# Patient Record
Sex: Male | Born: 1995 | Race: Black or African American | Hispanic: No | Marital: Married | Smoking: Current every day smoker
Health system: Southern US, Community
[De-identification: ages and names within clinical notes are randomized; demographics above are authoritative.]

## PROBLEM LIST (undated history)

## (undated) HISTORY — PX: TESTICLE REMOVAL: SHX68

---

## 2021-04-12 ENCOUNTER — Other Ambulatory Visit: Payer: Self-pay

## 2021-04-12 ENCOUNTER — Emergency Department (HOSPITAL_COMMUNITY)
Admission: EM | Admit: 2021-04-12 | Discharge: 2021-04-12 | Disposition: A | Discharge status: Home / Self Care | Payer: Self-pay

## 2021-04-12 ENCOUNTER — Emergency Department (HOSPITAL_COMMUNITY): Payer: Self-pay

## 2021-04-12 ENCOUNTER — Encounter (HOSPITAL_COMMUNITY): Payer: Self-pay | Admitting: Emergency Medicine

## 2021-04-12 ENCOUNTER — Emergency Department (HOSPITAL_COMMUNITY)
Admission: EM | Admit: 2021-04-12 | Discharge: 2021-04-12 | Disposition: A | Discharge status: Home / Self Care | Payer: Self-pay | Attending: Emergency Medicine | Admitting: Emergency Medicine

## 2021-04-12 DIAGNOSIS — S62141A Displaced fracture of body of hamate [unciform] bone, right wrist, initial encounter for closed fracture: Secondary | ICD-10-CM | POA: Insufficient documentation

## 2021-04-12 DIAGNOSIS — W228XXA Striking against or struck by other objects, initial encounter: Secondary | ICD-10-CM | POA: Insufficient documentation

## 2021-04-12 MED ORDER — OXYCODONE-ACETAMINOPHEN 5-325 MG PO TABS: 1.0000 | ORAL_TABLET | Freq: Four times a day (QID) | ORAL | 0 refills | Status: AC | PRN

## 2021-04-12 MED ORDER — OXYCODONE-ACETAMINOPHEN 5-325 MG PO TABS
2.0000 | ORAL_TABLET | Freq: Once | ORAL | Status: AC
  Administered 2021-04-12: 2 via ORAL
  Filled 2021-04-12: qty 2

## 2021-04-12 MED ORDER — OXYCODONE-ACETAMINOPHEN 5-325 MG PO TABS
1.0000 | ORAL_TABLET | Freq: Three times a day (TID) | ORAL | 0 refills | Status: AC | PRN
Start: 2021-04-12 — End: ?

## 2021-04-12 NOTE — ED Triage Notes (Signed)
Pt states right hand got stuck between 2 pieces of furniture while moving yesterday.

## 2021-04-12 NOTE — ED Provider Notes (Signed)
[facility] EMERGENCY DEPARTMENT Provider Note   CSN: [id number] Arrival date & time: 04/12/21  7829     History  Chief Complaint  Patient presents with   Hand Pain    Daniel Drake is a 26 y.o. male.  Patient was movement dresser yesterday when it pinned his right hand between the dresser and a railing going on the steps.  He has had persistent pain and swelling to his right wrist and hand since that time.  Presents here for evaluation.  No other injuries.  No home medications.   Hand Pain      Home Medications Prior to Admission medications   Medication Sig Start Date End Date Taking? Authorizing Provider  oxyCODONE-acetaminophen (PERCOCET) 5-325 MG tablet Take 1-2 tablets by mouth every 8 (eight) hours as needed for severe pain. 04/12/21  Yes Kirstein Baxley, Barbara Cower, MD  oxyCODONE-acetaminophen (PERCOCET/ROXICET) 5-325 MG tablet Take 1 tablet by mouth every 6 (six) hours as needed for severe pain. 04/12/21  Yes Kerron Sedano, Barbara Cower, MD      Allergies    Patient has no allergy information on record.    Review of Systems   Review of Systems  Physical Exam Updated Vital Signs BP 135/86    Pulse (!) 56    Temp 98.6 F (37 C)    Resp 18    Ht 5\' 7"  (1.702 m)    Wt 56.7 kg    SpO2 100%    BMI 19.58 kg/m  Physical Exam Vitals and nursing note reviewed.  Constitutional:      Appearance: He is well-developed.  HENT:     Head: Normocephalic and atraumatic.     Mouth/Throat:     Mouth: Mucous membranes are moist.     Pharynx: Oropharynx is clear.  Eyes:     Pupils: Pupils are equal, round, and reactive to light.  Cardiovascular:     Rate and Rhythm: Normal rate.  Pulmonary:     Effort: Pulmonary effort is normal. No respiratory distress.  Abdominal:     General: There is no distension.  Musculoskeletal:        General: Tenderness (Pain, swelling and tenderness over dorsum of his right hand) present. Normal range of motion.     Cervical back: Normal range of motion.  Skin:    General:  Skin is warm and dry.  Neurological:     General: No focal deficit present.     Mental Status: He is alert.    ED Results / Procedures / Treatments   Labs (all labs ordered are listed, but only abnormal results are displayed) Labs Reviewed - No data to display  EKG None  Radiology DG Wrist Complete Right  Result Date: 04/12/2021 CLINICAL DATA:  Pain and swelling to the right hand wrist after crush injury moving furniture. EXAM: RIGHT WRIST - COMPLETE 3+ VIEW COMPARISON:  None. FINDINGS: Three views study shows a fracture in the ulnar aspect of the distal carpus. This is more distal than typically seen for classic triquetral fracture and is probably a fracture involving the posterior hamate extending into the articular surface distally. No other acute fracture identified. No subluxation dislocation. IMPRESSION: Posterior fracture involving the ulnar aspect of the distal carpus, compatible with a fracture involving the hamate extending to the distal articular surface. CT imaging could be used to further evaluate as clinically warranted. Electronically Signed   By: 04/14/2021 M.D.   On: 04/12/2021 06:10   DG Hand Complete Right  Result Date: 04/12/2021 CLINICAL  DATA:  Pain and swelling after crush injury moving furniture. EXAM: RIGHT HAND - COMPLETE 3+ VIEW COMPARISON:  None. FINDINGS: As on the wrist x-rays, there is a fracture fragment seen posteriorly and arising from the distal carpus. Fracture involves the hamate bone with extension into the articular surface with the fifth metacarpal. No other acute fracture evident. No subluxation or dislocation. IMPRESSION: Hamate bone fracture with intra-articular extension into the [facility] joint. Electronically Signed   By: Kennith Center M.D.   On: 04/12/2021 06:13    Procedures Procedures    Medications Ordered in ED Medications  oxyCODONE-acetaminophen (PERCOCET/ROXICET) 5-325 MG per tablet 2 tablet (2 tablets Oral Given 04/12/21 0555)    ED  Course/ Medical Decision Making/ A&P                           Medical Decision Making Amount and/or Complexity of Data Reviewed Radiology: ordered.  Risk Prescription drug management.   X-ray reviewed by myself and verified by radiology shows a hamate fracture.  Will place in a splint and a sling.  Discussed with Dr. Dallas Schimke with orthopedic surgery whose office will call with an appointment in the next week or so.  No further management in the emergency department required for the orthopedic surgeon.   Final Clinical Impression(s) / ED Diagnoses Final diagnoses:  Closed displaced fracture of hamate bone of right wrist, unspecified portion of hamate, initial encounter    Rx / DC Orders ED Discharge Orders          Ordered    oxyCODONE-acetaminophen (PERCOCET/ROXICET) 5-325 MG tablet  Every 6 hours PRN        04/12/21 0637    oxyCODONE-acetaminophen (PERCOCET) 5-325 MG tablet  Every 8 hours PRN        04/12/21 0638              Ademide Schaberg, Barbara Cower, MD 04/12/21 7416

## 2021-04-14 ENCOUNTER — Other Ambulatory Visit: Payer: Self-pay

## 2021-04-14 DIAGNOSIS — S62141A Displaced fracture of body of hamate [unciform] bone, right wrist, initial encounter for closed fracture: Secondary | ICD-10-CM

## 2021-04-14 MED FILL — Oxycodone w/ Acetaminophen Tab 5-325 MG: ORAL | Qty: 6 | Status: AC

## 2021-04-14 NOTE — Progress Notes (Signed)
Dr. Amedeo Kinsman has reviewed pt images and would like him to be referred to Dr. Tempie Donning for evaluation. Called pt and let him know the steps that are being taken and he verbalized understanding.

## 2021-04-22 ENCOUNTER — Ambulatory Visit: Payer: Self-pay | Admitting: Orthopedic Surgery

## 2022-02-04 ENCOUNTER — Emergency Department (HOSPITAL_COMMUNITY)
Admission: EM | Admit: 2022-02-04 | Discharge: 2022-02-05 | Disposition: A | Discharge status: Home / Self Care | Payer: Self-pay | Attending: Emergency Medicine | Admitting: Emergency Medicine

## 2022-02-04 ENCOUNTER — Encounter (HOSPITAL_COMMUNITY): Payer: Self-pay

## 2022-02-04 ENCOUNTER — Other Ambulatory Visit: Payer: Self-pay

## 2022-02-04 ENCOUNTER — Emergency Department (HOSPITAL_COMMUNITY): Payer: Self-pay

## 2022-02-04 DIAGNOSIS — N451 Epididymitis: Secondary | ICD-10-CM

## 2022-02-04 DIAGNOSIS — N453 Epididymo-orchitis: Secondary | ICD-10-CM | POA: Insufficient documentation

## 2022-02-04 LAB — URINALYSIS, ROUTINE W REFLEX MICROSCOPIC
Bilirubin Urine: NEGATIVE
Glucose, UA: NEGATIVE mg/dL
Hgb urine dipstick: NEGATIVE
Ketones, ur: 20 mg/dL — AB
Leukocytes,Ua: NEGATIVE
Nitrite: NEGATIVE
Protein, ur: NEGATIVE mg/dL
Specific Gravity, Urine: 1.011 (ref 1.005–1.030)
pH: 8 (ref 5.0–8.0)

## 2022-02-04 LAB — BASIC METABOLIC PANEL
Anion gap: 9 (ref 5–15)
BUN: 6 mg/dL (ref 6–20)
CO2: 25 mmol/L (ref 22–32)
Calcium: 9 mg/dL (ref 8.9–10.3)
Chloride: 102 mmol/L (ref 98–111)
Creatinine, Ser: 0.81 mg/dL (ref 0.61–1.24)
GFR, Estimated: >60 mL/min (ref >60)
Glucose, Bld: 87 mg/dL (ref 70–99)
Potassium: 3.4 mmol/L — ABNORMAL LOW (ref 3.5–5.1)
Sodium: 136 mmol/L (ref 135–145)

## 2022-02-04 LAB — CBC
HCT: 38 % — ABNORMAL LOW (ref 39.0–52.0)
Hemoglobin: 13.8 g/dL (ref 13.0–17.0)
MCH: 29.6 pg (ref 26.0–34.0)
MCHC: 36.3 g/dL — ABNORMAL HIGH (ref 30.0–36.0)
MCV: 81.4 fL (ref 80.0–100.0)
Platelets: 306 10*3/uL (ref 150–400)
RBC: 4.67 MIL/uL (ref 4.22–5.81)
RDW: 12.2 % (ref 11.5–15.5)
WBC: 8.7 10*3/uL (ref 4.0–10.5)
nRBC: 0 % (ref 0.0–0.2)

## 2022-02-04 MED ORDER — OXYCODONE-ACETAMINOPHEN 5-325 MG PO TABS
2.0000 | ORAL_TABLET | Freq: Once | ORAL | Status: AC
  Administered 2022-02-04: 2 via ORAL
  Filled 2022-02-04: qty 2

## 2022-02-04 NOTE — ED Notes (Signed)
ED Provider at bedside. 

## 2022-02-04 NOTE — ED Triage Notes (Signed)
POV from home . Cc of swelling and pain in his left testicle. Says he only has one, the other was removed when he was younger. Denies urinary symptoms.

## 2022-02-04 NOTE — ED Provider Notes (Signed)
Care assumed from Dr. Hyacinth Meeker, patient with one testicle which has been swollen and painful, pending scrotal ultrasound.  Ultrasound shows evidence of epididymoorchitis, no evidence of torsion.  Have independently viewed the images, and agree with the radiologist's interpretation.  I have ordered a dose of ceftriaxone and doxycycline and I am discharging him with a prescription for a 2-week course of doxycycline.  Results for orders placed or performed during the hospital encounter of 02/04/22  Urinalysis, Routine w reflex microscopic  Result Value Ref Range   Color, Urine YELLOW YELLOW   APPearance CLEAR CLEAR   Specific Gravity, Urine 1.011 1.005 - 1.030   pH 8.0 5.0 - 8.0   Glucose, UA NEGATIVE NEGATIVE mg/dL   Hgb urine dipstick NEGATIVE NEGATIVE   Bilirubin Urine NEGATIVE NEGATIVE   Ketones, ur 20 (A) NEGATIVE mg/dL   Protein, ur NEGATIVE NEGATIVE mg/dL   Nitrite NEGATIVE NEGATIVE   Leukocytes,Ua NEGATIVE NEGATIVE  CBC  Result Value Ref Range   WBC 8.7 4.0 - 10.5 K/uL   RBC 4.67 4.22 - 5.81 MIL/uL   Hemoglobin 13.8 13.0 - 17.0 g/dL   HCT 38.2 (L) 50.5 - 39.7 %   MCV 81.4 80.0 - 100.0 fL   MCH 29.6 26.0 - 34.0 pg   MCHC 36.3 (H) 30.0 - 36.0 g/dL   RDW 67.3 41.9 - 37.9 %   Platelets 306 150 - 400 K/uL   nRBC 0.0 0.0 - 0.2 %  Basic metabolic panel  Result Value Ref Range   Sodium 136 135 - 145 mmol/L   Potassium 3.4 (L) 3.5 - 5.1 mmol/L   Chloride 102 98 - 111 mmol/L   CO2 25 22 - 32 mmol/L   Glucose, Bld 87 70 - 99 mg/dL   BUN 6 6 - 20 mg/dL   Creatinine, Ser 0.24 0.61 - 1.24 mg/dL   Calcium 9.0 8.9 - 09.7 mg/dL   GFR, Estimated >35 >32 mL/min   Anion gap 9 5 - 15   US SCROTUM W/DOPPLER  Result Date: 02/04/2022 CLINICAL DATA:  Testicle pain on the left with swelling EXAM: SCROTAL ULTRASOUND DOPPLER ULTRASOUND OF THE TESTICLES TECHNIQUE: Complete ultrasound examination of the testicles, epididymis, and other scrotal structures was performed. Color and spectral Doppler  ultrasound were also utilized to evaluate blood flow to the testicles. COMPARISON:  None Available. FINDINGS: Right testicle Surgically removed. Left testicle Measurements: 3.9 x 2.2 x 2.9 cm. No mass or microlithiasis. Increased vascularity. Diffuse scrotal wall thickening/edema with hyperemia. Right epididymis:  Removed. Left epididymis:  Increased vascularity. Hydrocele:  None visualized. Varicocele:  None visualized. Pulsed Doppler interrogation of both testes demonstrates normal low resistance arterial and venous waveforms bilaterally. IMPRESSION: Findings consistent with left epididymo-orchitis. Electronically Signed   By: Minerva Fester M.D.   On: 02/04/2022 23:39      Dione Booze, MD 02/05/22 [phone]

## 2022-02-04 NOTE — ED Provider Notes (Signed)
[facility] EMERGENCY DEPARTMENT Provider Note   CSN: [id number] Arrival date & time: 02/04/22  2145     History {Add pertinent medical, surgical, social history, OB history to HPI:1} Chief Complaint  Patient presents with   Testicle Pain    Daniel Drake is a 26 y.o. male.   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  He reports that over the last 7 days he has had recurrent swelling now on the left testicle which is 1 remaining testicle, he has occasional difficulty urinating, he does not have any blood or pus coming from the tip of the penis and denies any penile shaft irritation or pain.  He has not had any fevers or chills but does get nauseated and has some lower abdominal discomfort.  The testicle has had some intermittent fluctuating discomfort but today was much more swollen than it has been.  His girlfriend finally made him come to the hospital tonight to get checked.    Home Medications Prior to Admission medications   Medication Sig Start Date End Date Taking? Authorizing Provider  oxyCODONE-acetaminophen (PERCOCET) 5-325 MG tablet Take 1-2 tablets by mouth every 8 (eight) hours as needed for severe pain. 04/12/21   Mesner, Barbara Cower, MD  oxyCODONE-acetaminophen (PERCOCET/ROXICET) 5-325 MG tablet Take 1 tablet by mouth every 6 (six) hours as needed for severe pain. 04/12/21   Mesner, Barbara Cower, MD      Allergies    Patient has no known allergies.    Review of Systems   Review of Systems  Genitourinary:  Positive for testicular pain.  All other systems reviewed and are negative.   Physical Exam Updated Vital Signs BP 135/78 (BP Location: Right Arm)   Pulse 85   Temp 99.3 F (37.4 C) (Oral)   Resp 16   Ht 1.702 m (5\' 7" )   Wt 57 kg    SpO2 100%   BMI 19.68 kg/m  Physical Exam Vitals and nursing note reviewed.  Constitutional:      General: He is not in acute distress.    Appearance: He is well-developed.  HENT:     Head: Normocephalic and atraumatic.     Mouth/Throat:     Pharynx: No oropharyngeal exudate.  Eyes:     General: No scleral icterus.       Right eye: No discharge.        Left eye: No discharge.     Conjunctiva/sclera: Conjunctivae normal.     Pupils: Pupils are equal, round, and reactive to light.  Neck:     Thyroid: No thyromegaly.     Vascular: No JVD.  Cardiovascular:     Rate and Rhythm: Normal rate and regular rhythm.     Heart sounds: Normal heart sounds. No murmur heard.    No friction rub. No gallop.  Pulmonary:     Effort: Pulmonary effort is normal. No respiratory distress.     Breath sounds: Normal breath sounds. No wheezing or rales.  Abdominal:     General: Bowel sounds are normal. There is no distension.     Palpations: Abdomen is soft. There is no mass.     Tenderness: There is no abdominal tenderness.  Genitourinary:    Comments: Normal-appearing penis, uncircumcised, scrotum  appears to be swollen and mildly edematous with erythema and tenderness.  No cremasteric reflex present Musculoskeletal:        General: No tenderness. Normal range of motion.     Cervical back: Normal range of motion and neck supple.  Lymphadenopathy:     Cervical: No cervical adenopathy.  Skin:    General: Skin is warm and dry.     Findings: No erythema or rash.  Neurological:     Mental Status: He is alert.     Coordination: Coordination normal.  Psychiatric:        Behavior: Behavior normal.     ED Results / Procedures / Treatments   Labs (all labs ordered are listed, but only abnormal results are displayed) Labs Reviewed  URINALYSIS, ROUTINE W REFLEX MICROSCOPIC  CBC  BASIC METABOLIC PANEL    EKG None  Radiology No results found.  Procedures Procedures  {Document cardiac  monitor, telemetry assessment procedure when appropriate:1}  Medications Ordered in ED Medications  oxyCODONE-acetaminophen (PERCOCET/ROXICET) 5-325 MG per tablet 2 tablet (has no administration in time range)    ED Course/ Medical Decision Making/ A&P                           Medical Decision Making Amount and/or Complexity of Data Reviewed Labs: ordered. Radiology: ordered.  Risk Prescription drug management.   Needs formal US study to r/o torsion on his one remaining testicle. [place] ordered - no techs available on site - they will need to come in from home.  Has considered possibility - of orchitis / epiddidmitis.  Trauma, does not appear to be cellulitis.  Pt agreeable - normal VS.  {Document critical care time when appropriate:1} {Document review of labs and clinical decision tools ie heart score, Chads2Vasc2 etc:1}  {Document your independent review of radiology images, and any outside records:1} {Document your discussion with family members, caretakers, and with consultants:1} {Document social determinants of health affecting pt's care:1} {Document your decision making why or why not admission, treatments were needed:1} Final Clinical Impression(s) / ED Diagnoses Final diagnoses:  None    Rx / DC Orders ED Discharge Orders     None

## 2022-02-04 NOTE — ED Notes (Signed)
US completed

## 2022-02-05 MED ORDER — DOXYCYCLINE HYCLATE 100 MG PO CAPS: 100.0000 mg | ORAL_CAPSULE | Freq: Two times a day (BID) | ORAL | 0 refills | Status: AC

## 2022-02-05 MED ORDER — CEFTRIAXONE SODIUM 500 MG IJ SOLR
500.0000 mg | Freq: Once | INTRAMUSCULAR | Status: AC
Start: 2022-02-05 — End: 2022-02-05
  Administered 2022-02-05: 500 mg via INTRAMUSCULAR
  Filled 2022-02-05: qty 500

## 2022-02-05 MED ORDER — DOXYCYCLINE HYCLATE 100 MG PO TABS
100.0000 mg | ORAL_TABLET | Freq: Once | ORAL | Status: AC
  Administered 2022-02-05: 100 mg via ORAL
  Filled 2022-02-05: qty 1

## 2022-02-05 NOTE — Discharge Instructions (Signed)
Use an athletic supporter as needed.  Apply ice for 30 minutes at a time, 4 times a day.  You may take ibuprofen or naproxen as needed for pain.  If you need additional pain relief, you may add acetaminophen.

## 2022-05-11 ENCOUNTER — Encounter: Payer: Self-pay | Admitting: Radiology

## 2023-12-21 IMAGING — DX DG HAND COMPLETE 3+V*R*
3 series · 3 of 3 positions shown · non-contrast
Comparison: None.

CLINICAL DATA: Pain and swelling after crush injury moving
furniture.

EXAM:
RIGHT HAND - COMPLETE 3+ VIEW

[hand ap]
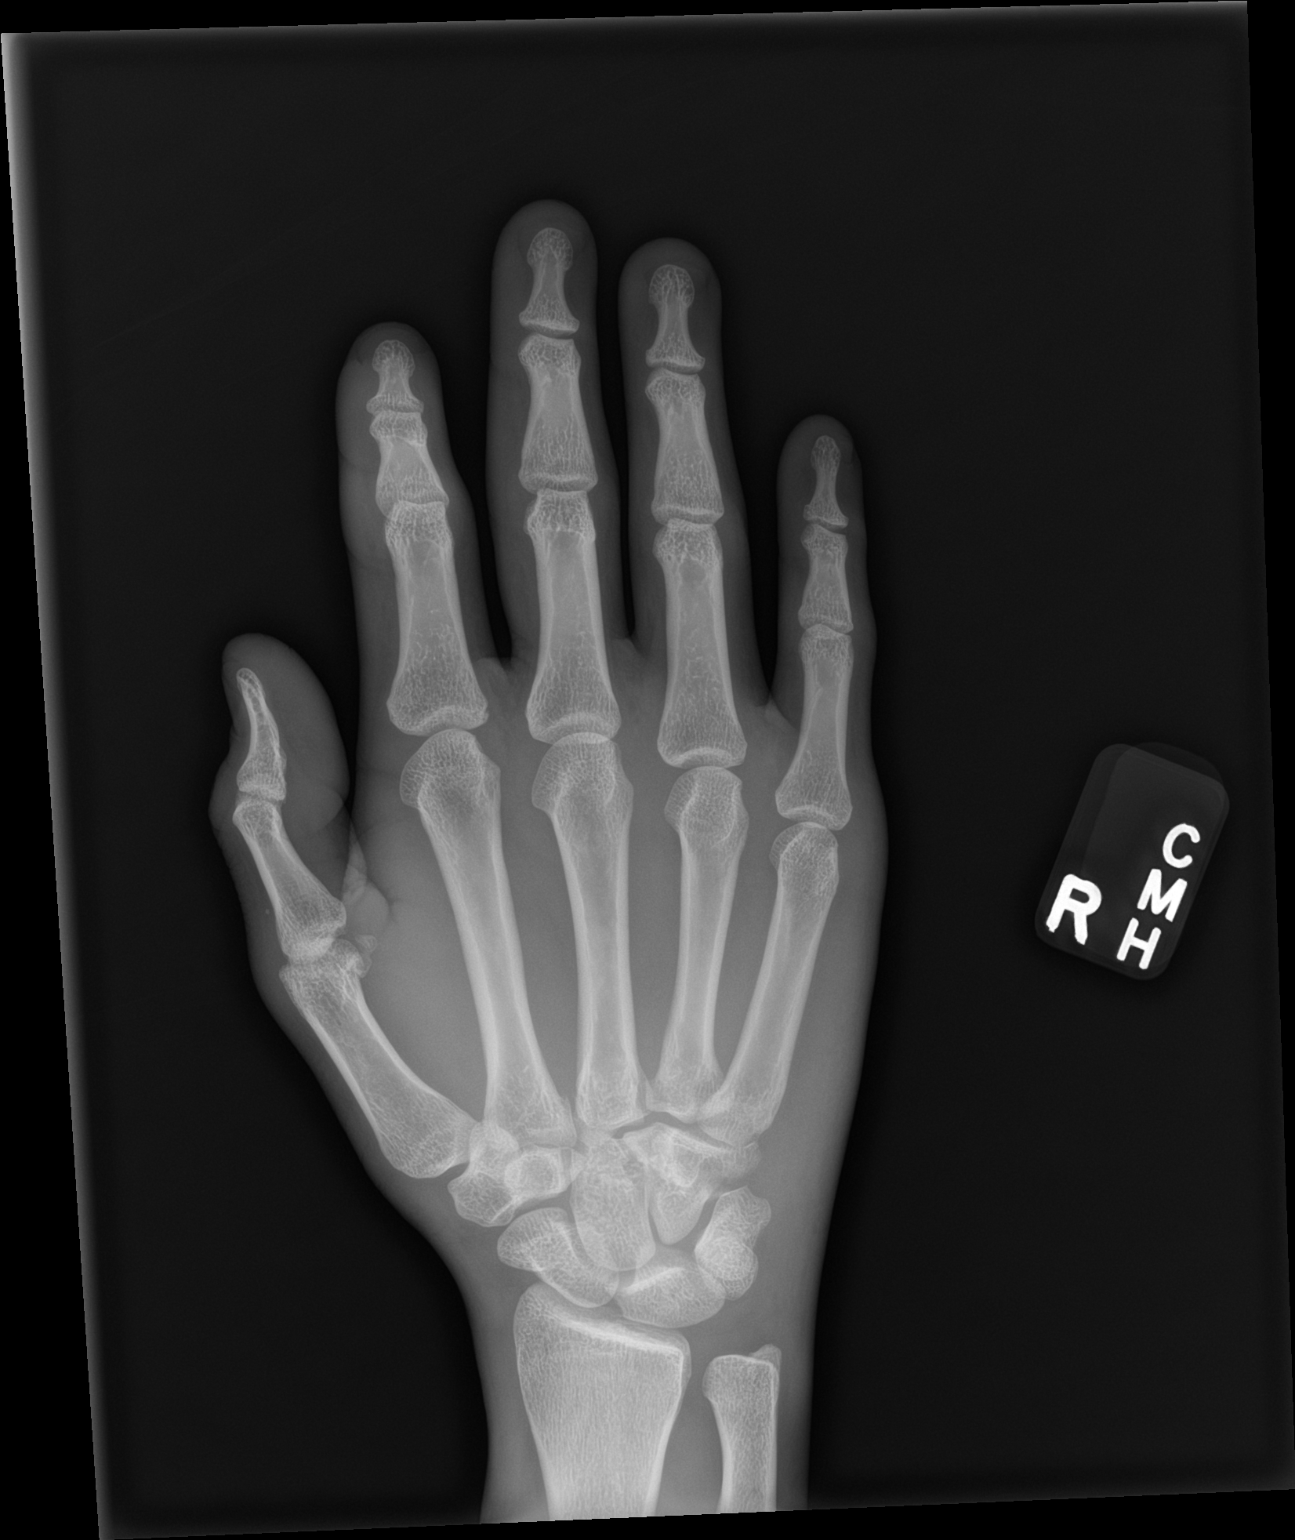

[hand obl]
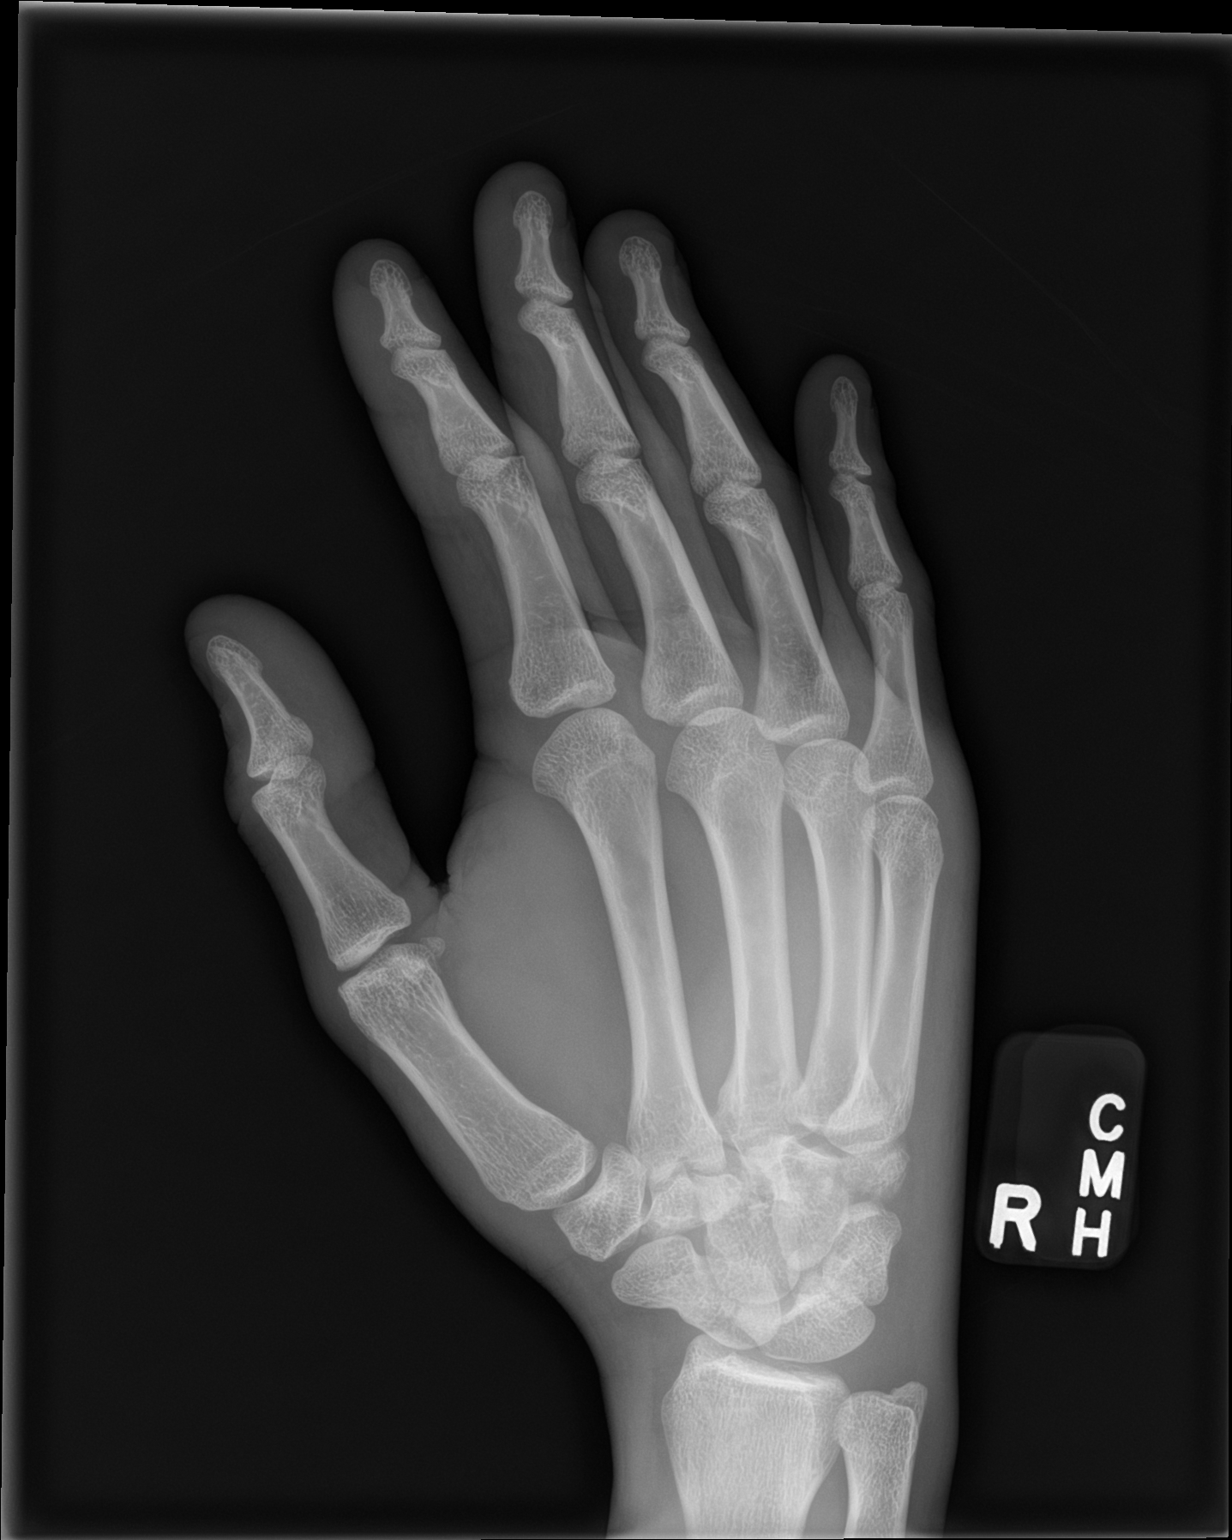

[hand lat]
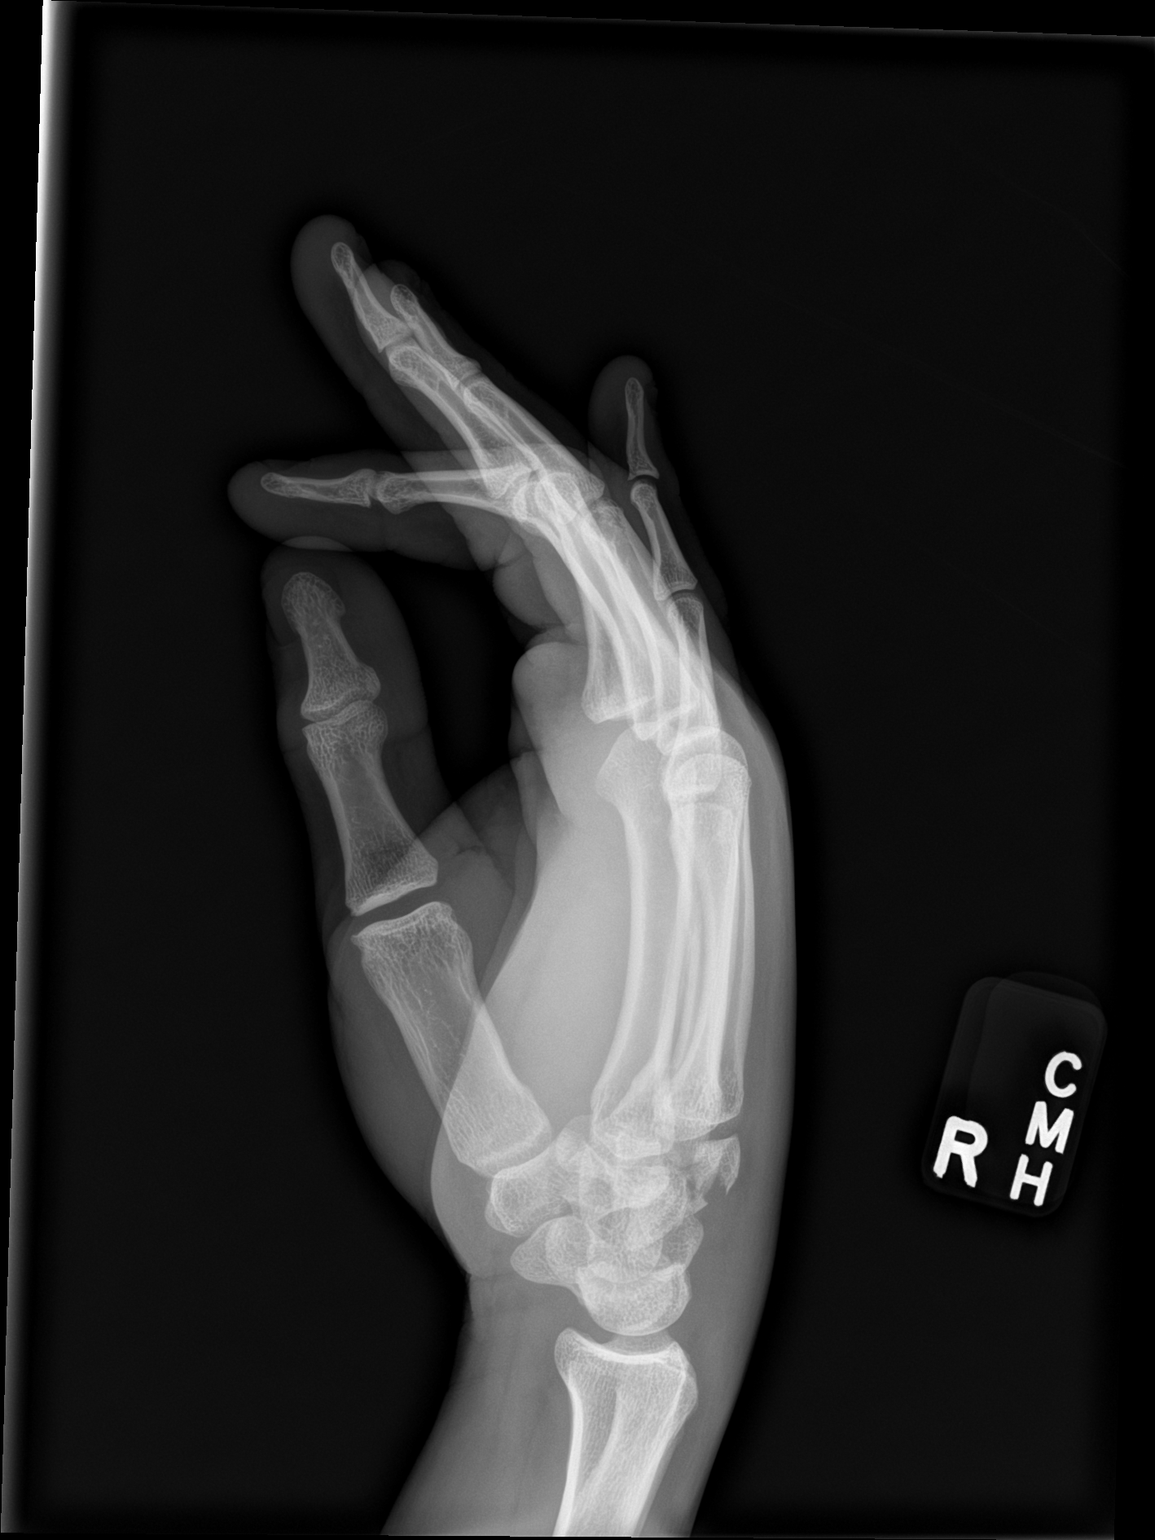

[3 of 3 positions shown; findings below may reference images not displayed]

FINDINGS: As on the wrist x-rays, there is a fracture fragment seen
posteriorly and arising from the distal carpus. Fracture involves
the hamate bone with extension into the articular surface with the
fifth metacarpal. No other acute fracture evident. No subluxation or
dislocation.
IMPRESSION: Hamate bone fracture with intra-articular extension into the [facility]
joint.

## 2023-12-21 IMAGING — DX DG WRIST COMPLETE 3+V*R*
3 series · 3 of 3 positions shown · non-contrast
Comparison: None.

CLINICAL DATA: Pain and swelling to the right hand wrist after
crush injury moving furniture.

EXAM:
RIGHT WRIST - COMPLETE 3+ VIEW

[wrist ap]
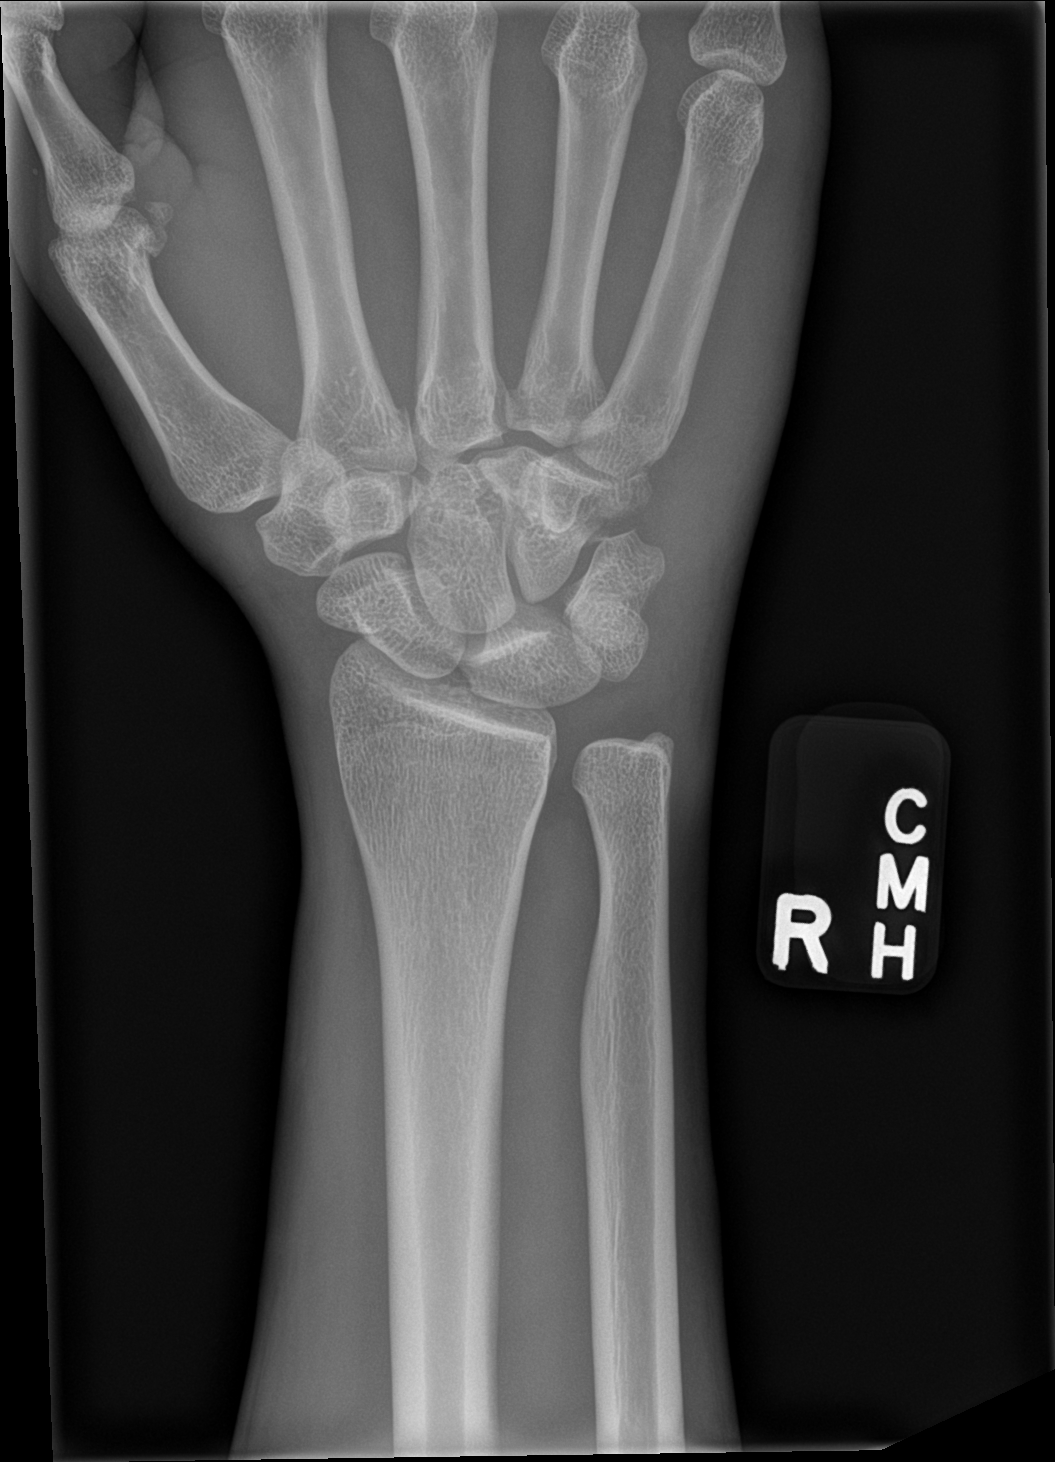

[wrist obl]
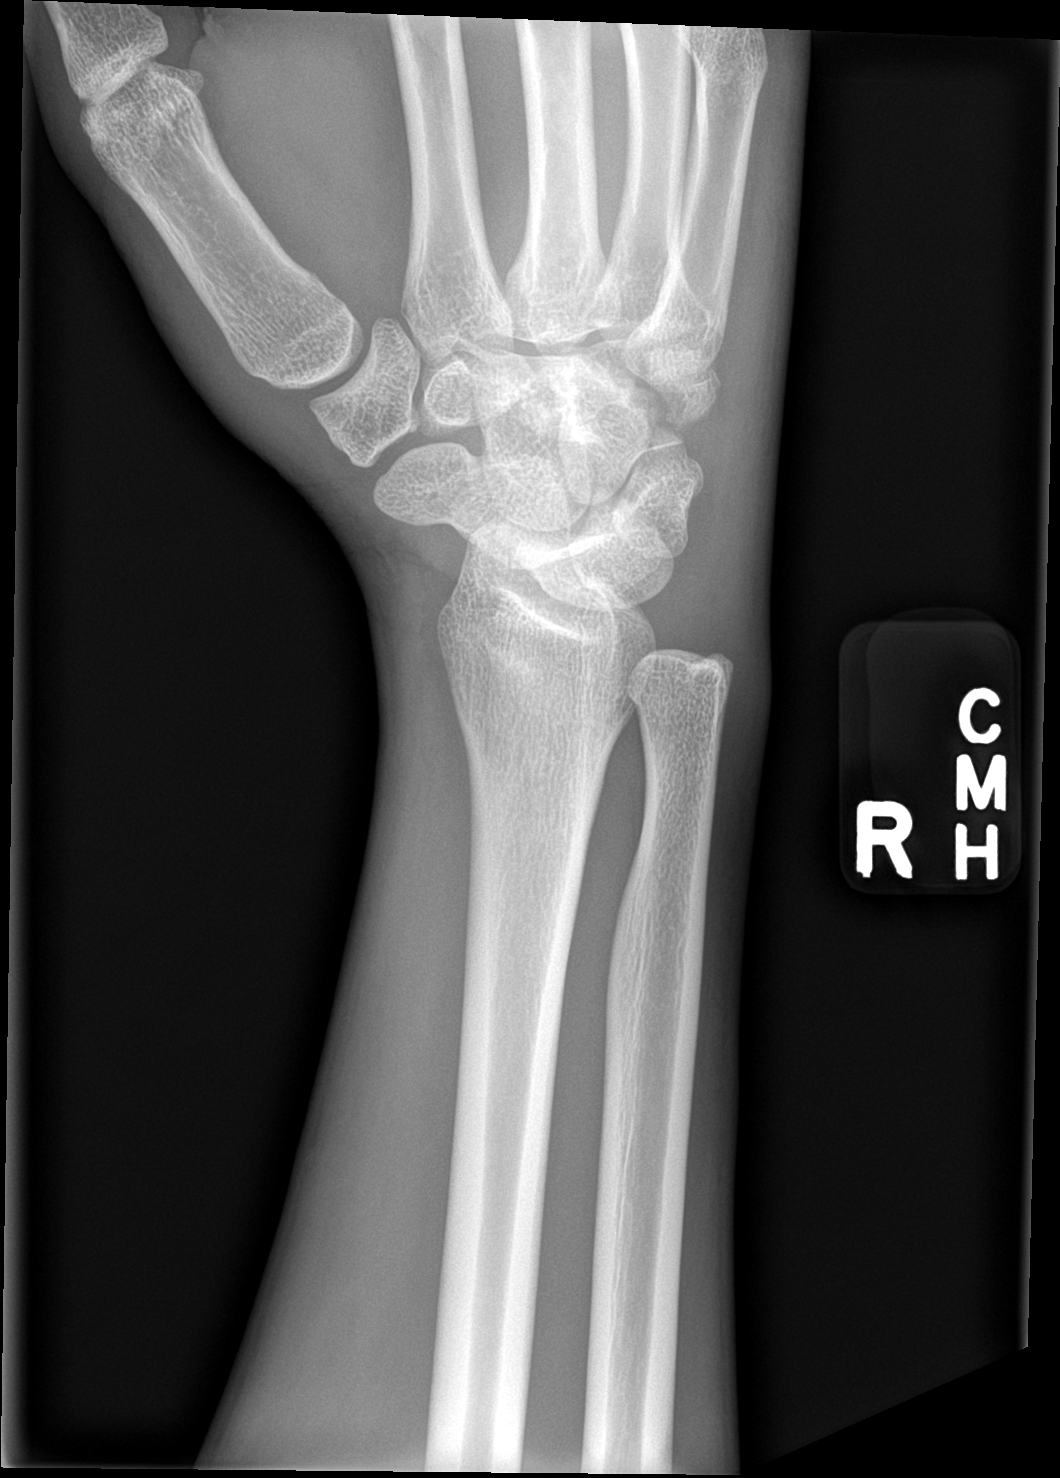

[wrist lat]
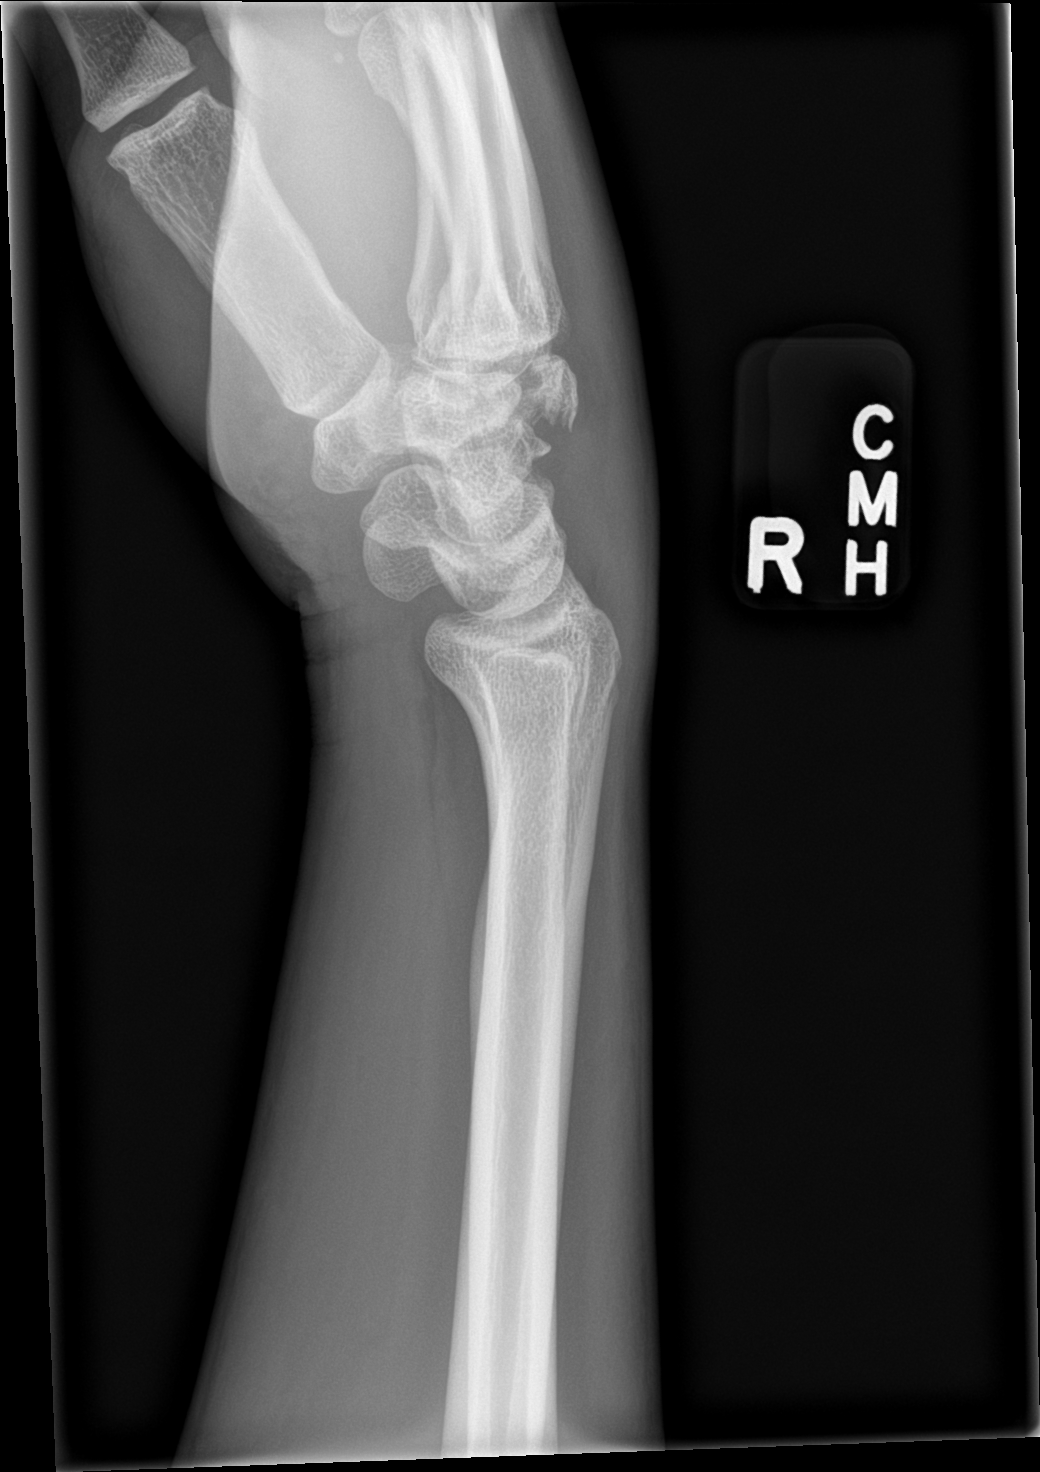

[3 of 3 positions shown; findings below may reference images not displayed]

FINDINGS: Three views study shows a fracture in the ulnar aspect of the distal
carpus. This is more distal than typically seen for classic
triquetral fracture and is probably a fracture involving the
posterior hamate extending into the articular surface distally. No
other acute fracture identified. No subluxation dislocation.
IMPRESSION: Posterior fracture involving the ulnar aspect of the distal carpus,
compatible with a fracture involving the hamate extending to the
distal articular surface. CT imaging could be used to further
evaluate as clinically warranted.
